# Patient Record
Sex: Male | Born: 1980 | Race: Black or African American | Hispanic: No | Marital: Single | Smoking: Current some day smoker
Health system: Southern US, Community
[De-identification: ages and names within clinical notes are randomized; demographics above are authoritative.]

---

## 2002-08-04 ENCOUNTER — Emergency Department (HOSPITAL_COMMUNITY): Admission: EM | Admit: 2002-08-04 | Discharge: 2002-08-04 | Payer: Self-pay

## 2002-08-04 ENCOUNTER — Encounter: Payer: Self-pay | Admitting: Emergency Medicine

## 2008-05-24 ENCOUNTER — Emergency Department (HOSPITAL_COMMUNITY): Admission: EM | Admit: 2008-05-24 | Discharge: 2008-05-24 | Payer: Self-pay | Admitting: Emergency Medicine

## 2010-12-21 ENCOUNTER — Emergency Department (HOSPITAL_COMMUNITY)
Admission: EM | Admit: 2010-12-21 | Discharge: 2010-12-21 | Disposition: A | Discharge status: Home / Self Care | Payer: No Typology Code available for payment source | Attending: Emergency Medicine | Admitting: Emergency Medicine

## 2010-12-21 ENCOUNTER — Emergency Department (HOSPITAL_COMMUNITY): Payer: No Typology Code available for payment source

## 2010-12-21 DIAGNOSIS — R221 Localized swelling, mass and lump, neck: Secondary | ICD-10-CM | POA: Insufficient documentation

## 2010-12-21 DIAGNOSIS — R220 Localized swelling, mass and lump, head: Secondary | ICD-10-CM | POA: Insufficient documentation

## 2010-12-21 DIAGNOSIS — S0003XA Contusion of scalp, initial encounter: Secondary | ICD-10-CM | POA: Insufficient documentation

## 2010-12-21 DIAGNOSIS — IMO0002 Reserved for concepts with insufficient information to code with codable children: Secondary | ICD-10-CM | POA: Insufficient documentation

## 2010-12-21 DIAGNOSIS — Y929 Unspecified place or not applicable: Secondary | ICD-10-CM | POA: Insufficient documentation

## 2010-12-21 DIAGNOSIS — R51 Headache: Secondary | ICD-10-CM | POA: Insufficient documentation

## 2012-02-13 ENCOUNTER — Encounter (HOSPITAL_COMMUNITY): Payer: Self-pay | Admitting: *Deleted

## 2012-02-13 ENCOUNTER — Emergency Department (HOSPITAL_COMMUNITY)
Admission: EM | Admit: 2012-02-13 | Discharge: 2012-02-14 | Disposition: A | Discharge status: Home / Self Care | Payer: Self-pay | Attending: Emergency Medicine | Admitting: Emergency Medicine

## 2012-02-13 ENCOUNTER — Emergency Department (HOSPITAL_COMMUNITY): Payer: Self-pay

## 2012-02-13 DIAGNOSIS — J329 Chronic sinusitis, unspecified: Secondary | ICD-10-CM | POA: Insufficient documentation

## 2012-02-13 DIAGNOSIS — R05 Cough: Secondary | ICD-10-CM

## 2012-02-13 DIAGNOSIS — R51 Headache: Secondary | ICD-10-CM | POA: Insufficient documentation

## 2012-02-13 DIAGNOSIS — R059 Cough, unspecified: Secondary | ICD-10-CM | POA: Insufficient documentation

## 2012-02-13 DIAGNOSIS — R509 Fever, unspecified: Secondary | ICD-10-CM | POA: Insufficient documentation

## 2012-02-13 NOTE — ED Notes (Signed)
Pt reports generalized body aches x 1 week

## 2012-02-14 MED ORDER — AZITHROMYCIN 250 MG PO TABS
500.0000 mg | ORAL_TABLET | Freq: Once | ORAL | Status: AC
  Administered 2012-02-14: 500 mg via ORAL
  Filled 2012-02-14: qty 2

## 2012-02-14 MED ORDER — AZITHROMYCIN 250 MG PO TABS: 250.0000 mg | ORAL_TABLET | Freq: Every day | ORAL | Status: AC

## 2012-02-14 MED ORDER — NAPROXEN 500 MG PO TABS: 500.0000 mg | ORAL_TABLET | Freq: Two times a day (BID) | ORAL | Status: AC

## 2012-02-14 NOTE — ED Provider Notes (Signed)
History     CSN: [id number]  Arrival date & time 02/13/12  2321   First MD Initiated Contact with Patient 02/14/12 0006      Chief Complaint  Patient presents with  . Influenza    (Consider location/radiation/quality/duration/timing/severity/associated sxs/prior treatment) HPI Comments: 31 year old male with no significant past medical history presents with a complaint of cough over the last week. This is gradually getting worse, productive of some phlegm and associated with headaches, sinus pressure and drainage. Symptoms are persistent, but gradual in onset, are associated with mild fevers and myalgias which have been treated with ibuprofen successfully.  Patient is a 31 y.o. male presenting with flu symptoms. The history is provided by the patient and a relative.  Influenza Pertinent negatives include no chest pain.    History reviewed. No pertinent past medical history.  History reviewed. No pertinent past surgical history.  No family history on file.  History  Substance Use Topics  . Smoking status: Current Some Day Smoker  . Smokeless tobacco: Not on file  . Alcohol Use: Yes      Review of Systems  Constitutional: Positive for fever.  HENT: Positive for congestion, rhinorrhea, postnasal drip and sinus pressure. Negative for ear pain, sore throat, neck pain and neck stiffness.   Respiratory: Positive for cough.   Cardiovascular: Negative for chest pain and leg swelling.  Gastrointestinal: Negative for nausea and vomiting.  Skin: Negative for rash.    Allergies  Review of patient's allergies indicates no known allergies.  Home Medications   Current Outpatient Rx  Name Route Sig Dispense Refill  . AZITHROMYCIN 250 MG PO TABS Oral Take 1 tablet (250 mg total) by mouth daily. 500mg  PO day 1, then 250mg  PO days 205 6 tablet 0  . NAPROXEN 500 MG PO TABS Oral Take 1 tablet (500 mg total) by mouth 2 (two) times daily with a meal. 30 tablet 0    BP 100/62  Pulse  109  Temp(Src) 100.7 F (38.2 C) (Oral)  Resp 20  Ht 5\' 8"  (1.727 m)  Wt 140 lb (63.504 kg)  BMI 21.29 kg/m2  SpO2 98%  Physical Exam  Nursing note and vitals reviewed. Constitutional: He appears well-developed and well-nourished. No distress.  HENT:  Head: Normocephalic and atraumatic.  Mouth/Throat: No oropharyngeal exudate.       Nasal passages with swollen turbinates and drainage bilaterally, oropharynx with mild erythema, no trismus  Eyes: Conjunctivae and EOM are normal. Pupils are equal, round, and reactive to light. Right eye exhibits no discharge. Left eye exhibits no discharge. No scleral icterus.  Neck: Normal range of motion. Neck supple. No JVD present. No thyromegaly present.  Cardiovascular: Normal rate, regular rhythm, normal heart sounds and intact distal pulses.  Exam reveals no gallop and no friction rub.   No murmur heard. Pulmonary/Chest: Effort normal and breath sounds normal. No respiratory distress. He has no wheezes. He has no rales.  Abdominal: There is no tenderness.  Musculoskeletal: He exhibits no tenderness.  Lymphadenopathy:    He has no cervical adenopathy.  Neurological: He is alert.  Skin: Skin is warm and dry. No rash noted. No erythema.  Psychiatric: He has a normal mood and affect. His behavior is normal.    ED Course  Procedures (including critical care time)  Labs Reviewed - No data to display No results found.   1. Sinusitis   2. Cough   3. Headache       MDM  Lungs are clear, oxygen  levels 98%, temperature of 100.7 with a pulse of 109 with symptoms consistent with a sinusitis and possible bronchitis. Will treat with Zithromax for infection including both lungs and sinuses. Patient has successfully taken oral medications and food and fluids without difficulty. We'll have followup as indicated. The patient has expressed his understanding of indications for followup.  Discharge Prescriptions include:  #1 Naprosyn #2  Zithromax        Vida Roller, MD 02/14/12 [phone]

## 2012-02-14 NOTE — Discharge Instructions (Signed)
 RESOURCE GUIDE  Dental Problems  Patients with Medicaid: [facility]                     [facility] [address]                                           [address] Phone:  [phone]                                                  Phone:  [phone]  If unable to pay or uninsured, contact:  [facility] or [facility] to become qualified for the adult dental clinic.  Chronic Pain Problems Contact [facility]  [phone] Patients need to be referred by their primary care doctor.  Insufficient Money for Medicine Contact [employer]:  call "211" or [facility] 271-5999.  No Primary Care Doctor Call [facility]  832-8000 Other agencies that provide inexpensive medical care    [facility]  832-8035    [facility]  832-7272    [facility]  [phone]    [facility] Clinic  [phone]    [facility]  [phone]    [facility]  [phone]  Psychological Services [facility]  [phone] [facility]  [phone] [facility]   800 853-5163 (emergency services 641-4993)  Substance Abuse Resources [facility]  336-882-2125 [facility] 336-784-9470 [facility] 336-285-9073 [facility] 336-845-3988 Residential & Outpatient Substance Abuse Program  800-659-3381  Abuse/Neglect [place] Child Abuse Hotline (336) 641-3795 [place] Child Abuse Hotline 800-378-5315 (After Hours)  Emergency Shelter [facility] (336) 271-5985  [facility] (336) 275-9566 [facility] (704) 372-4663  MRSA Hotline #:   832-7006    [place] [facility]  [facility] [place]     [employer]                          [facility] [address] [place]                       [address]      [address]  [place]                                                [place]                            [place] Phone:  [phone]                                   Phone:  [phone]                 Phone:  [phone]  [facility] Phone:    [phone]  [place] Child Abuse Hotline (336) 342-1394 (336) 342-3537 (After Hours)   

## 2012-02-14 NOTE — ED Notes (Signed)
Patient to room 5 from radiology. No distress. Equal chest rise and fall, regular, unlabored. Awaiting to be seen.

## 2012-02-14 NOTE — ED Notes (Signed)
MD at bedside to evaluate.

## 2013-03-24 ENCOUNTER — Emergency Department (HOSPITAL_COMMUNITY)
Admission: EM | Admit: 2013-03-24 | Discharge: 2013-03-24 | Disposition: A | Discharge status: Home / Self Care | Payer: 59 | Attending: Emergency Medicine | Admitting: Emergency Medicine

## 2013-03-24 ENCOUNTER — Emergency Department (HOSPITAL_COMMUNITY): Payer: 59

## 2013-03-24 ENCOUNTER — Encounter (HOSPITAL_COMMUNITY): Payer: Self-pay | Admitting: Emergency Medicine

## 2013-03-24 DIAGNOSIS — F17200 Nicotine dependence, unspecified, uncomplicated: Secondary | ICD-10-CM | POA: Insufficient documentation

## 2013-03-24 DIAGNOSIS — R221 Localized swelling, mass and lump, neck: Secondary | ICD-10-CM | POA: Insufficient documentation

## 2013-03-24 DIAGNOSIS — R51 Headache: Secondary | ICD-10-CM | POA: Insufficient documentation

## 2013-03-24 DIAGNOSIS — K047 Periapical abscess without sinus: Secondary | ICD-10-CM

## 2013-03-24 DIAGNOSIS — K029 Dental caries, unspecified: Secondary | ICD-10-CM | POA: Insufficient documentation

## 2013-03-24 DIAGNOSIS — K089 Disorder of teeth and supporting structures, unspecified: Secondary | ICD-10-CM | POA: Insufficient documentation

## 2013-03-24 DIAGNOSIS — R220 Localized swelling, mass and lump, head: Secondary | ICD-10-CM | POA: Insufficient documentation

## 2013-03-24 LAB — CBC WITH DIFFERENTIAL/PLATELET
Eosinophils Absolute: 0.2 10*3/uL (ref 0.0–0.7)
Hemoglobin: 15.5 g/dL (ref 13.0–17.0)
Lymphocytes Relative: 13 % (ref 12–46)
Lymphs Abs: 1.3 10*3/uL (ref 0.7–4.0)
MCH: 31.2 pg (ref 26.0–34.0)
MCV: 85.9 fL (ref 78.0–100.0)
Monocytes Relative: 9 % (ref 3–12)
Neutrophils Relative %: 76 % (ref 43–77)
Platelets: 188 10*3/uL (ref 150–400)
RBC: 4.97 MIL/uL (ref 4.22–5.81)
WBC: 9.3 10*3/uL (ref 4.0–10.5)

## 2013-03-24 LAB — BASIC METABOLIC PANEL
CO2: 29 mEq/L (ref 19–32)
GFR calc non Af Amer: >90 mL/min (ref >90)
Glucose, Bld: 97 mg/dL (ref 70–99)
Potassium: 4 mEq/L (ref 3.5–5.1)
Sodium: 137 mEq/L (ref 135–145)

## 2013-03-24 MED ORDER — SODIUM CHLORIDE 0.9 % IV SOLN
INTRAVENOUS | Status: DC
  Administered 2013-03-24: 1000 mL via INTRAVENOUS

## 2013-03-24 MED ORDER — IOHEXOL 300 MG/ML  SOLN
75.0000 mL | Freq: Once | INTRAMUSCULAR | Status: AC | PRN
  Administered 2013-03-24: 75 mL via INTRAVENOUS

## 2013-03-24 MED ORDER — CLINDAMYCIN PHOSPHATE 600 MG/50ML IV SOLN
600.0000 mg | Freq: Once | INTRAVENOUS | Status: AC
  Administered 2013-03-24: 600 mg via INTRAVENOUS
  Filled 2013-03-24: qty 50

## 2013-03-24 MED ORDER — HYDROCODONE-ACETAMINOPHEN 5-325 MG PO TABS: 1.0000 | ORAL_TABLET | ORAL | Status: AC | PRN

## 2013-03-24 MED ORDER — CLINDAMYCIN HCL 150 MG PO CAPS: 300.0000 mg | ORAL_CAPSULE | Freq: Three times a day (TID) | ORAL | Status: AC

## 2013-03-24 NOTE — ED Notes (Signed)
Pt states he started having dental pain in his upper front teeth on the left side on Sunday. Pt reports that his face started to swell yesterday, but the swelling was worse when he woke up this morning.

## 2013-03-29 NOTE — ED Provider Notes (Signed)
Medical screening examination/treatment/procedure(s) were performed by non-physician practitioner and as supervising physician I was immediately available for consultation/collaboration. Evonte Prestage, MD, FACEP   Hanni Milford L Martin Belling, MD 03/29/13 1635 

## 2013-03-29 NOTE — ED Provider Notes (Signed)
History     CSN: [id number]  Arrival date & time 03/24/13  1011   First MD Initiated Contact with Patient 03/24/13 1124      Chief Complaint  Patient presents with  . Dental Pain  . Abscess    (Consider location/radiation/quality/duration/timing/severity/associated sxs/prior treatment) Patient is a 32 y.o. male presenting with tooth pain. The history is provided by the patient.  Dental PainThe primary symptoms include mouth pain. Primary symptoms do not include fever, shortness of breath or sore throat. The symptoms began 2 days ago. The symptoms are worsening. The symptoms are new. The symptoms occur constantly.  Mouth pain is worsening. Affected locations include: teeth. At its highest the mouth pain was at 10/10. The mouth pain is currently at 10/10.  Additional symptoms include: dental sensitivity to temperature, gum swelling and facial swelling. Medical issues include: periodontal disease.    History reviewed. No pertinent past medical history.  History reviewed. No pertinent past surgical history.  History reviewed. No pertinent family history.  History  Substance Use Topics  . Smoking status: Current Some Day Smoker -- 0.40 packs/day    Types: Cigarettes  . Smokeless tobacco: Never Used  . Alcohol Use: Yes      Review of Systems  Constitutional: Negative for fever and chills.  HENT: Positive for facial swelling and dental problem. Negative for sore throat, neck pain and neck stiffness.   Respiratory: Negative for shortness of breath.     Allergies  Review of patient's allergies indicates no known allergies.  Home Medications   Current Outpatient Rx  Name  Route  Sig  Dispense  Refill  . ibuprofen (ADVIL,MOTRIN) 800 MG tablet   Oral   Take 800 mg by mouth every 8 (eight) hours as needed for pain.         . clindamycin (CLEOCIN) 150 MG capsule   Oral   Take 2 capsules (300 mg total) by mouth 3 (three) times daily.   42 capsule   0   .  HYDROcodone-acetaminophen (NORCO/VICODIN) 5-325 MG per tablet   Oral   Take 1 tablet by mouth every 4 (four) hours as needed for pain.   20 tablet   0     BP 123/85  Pulse 78  Temp(Src) 98.1 F (36.7 C) (Oral)  Resp 19  Ht 5\' 8"  (1.727 m)  Wt 145 lb (65.772 kg)  BMI 22.05 kg/m2  SpO2 98%  Physical Exam  Constitutional: He is oriented to person, place, and time. He appears well-developed and well-nourished. No distress.  HENT:  Head: Atraumatic.  Right Ear: Tympanic membrane and external ear normal.  Left Ear: Tympanic membrane and external ear normal.  Mouth/Throat: Oropharynx is clear and moist and mucous membranes are normal. No oral lesions. Dental caries present.  Pt with generalized poor dentition and oral hygiene.  He has generalized edema along left upper gingiva with left buccal mucosal edema extending to infraorbital area.  There is no induration, no erythema or fluctuance of this soft tissue.    Eyes: Conjunctivae are normal.  Neck: Normal range of motion. Neck supple.  Cardiovascular: Normal rate and normal heart sounds.   Pulmonary/Chest: Effort normal.  Abdominal: He exhibits no distension.  Musculoskeletal: Normal range of motion.  Lymphadenopathy:    He has no cervical adenopathy.  Neurological: He is alert and oriented to person, place, and time.  Skin: Skin is warm and dry. No erythema.  Psychiatric: He has a normal mood and affect.    ED  Course  Procedures (including critical care time)  Pt was given IV clindamycin prior to dc home.  Labs Reviewed  CBC WITH DIFFERENTIAL - Abnormal; Notable for the following:    MCHC 36.3 (*)    All other components within normal limits  BASIC METABOLIC PANEL   No results found.   1. Dental abscess       MDM  Patients labs and/or radiological studies were viewed and considered during the medical decision making and disposition process.  Ct scan discussed with patient.  He was prescribed hydrocodone,   Clindamycin.  Advised to avoid applying ice or heat to his infected area.  Dental referrals given.  Also advised return here for a recheck in the interim for any worsened swelling or pain.  Prior to dc, the infraorbital edema was actually a little bit improved.  Pt understands plan.        Burgess Amor, PA-C 03/29/13 1233

## 2014-10-12 IMAGING — CT CT MAXILLOFACIAL W/ CM
3 of 4 series · 16 of 47 positions shown, 19 images · IV contrast (omnipaque)
Comparison: None

CLINICAL DATA: 32-year-old male with left upper teeth and facial
pain.

CT MAXILLOFACIAL WITH CONTRAST
TECHNIQUE: Multidetector CT imaging of the maxillofacial
structures was performed with intravenous contrast. Multiplanar CT
image reconstructions were also generated.
Contrast: 75mL OMNIPAQUE IOHEXOL 300 MG/ML  SOLN

[Series 2: max st axial · axial · 0.31mm/px · z∈[+6,+150]mm · 11 of 84 slices shown, 14 images]
[im 6/84  brain]
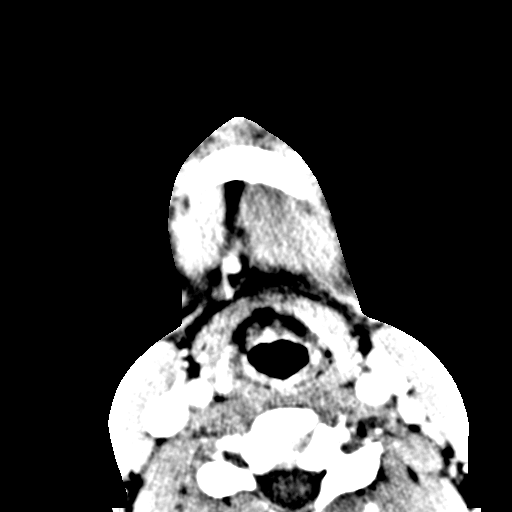
[im 6/84  bone]
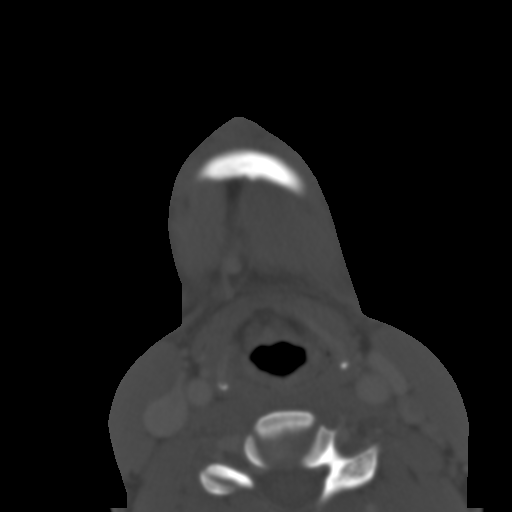
[im 12/84  bone]
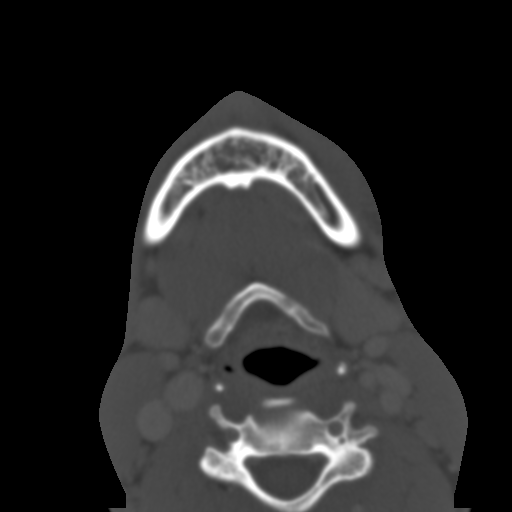
[im 21/84  bone]
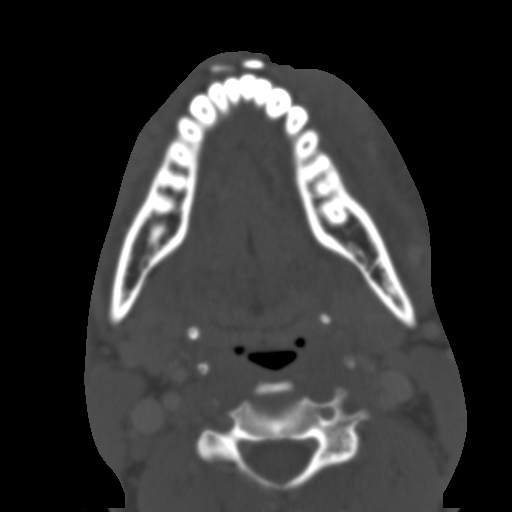
[im 26/84  bone]
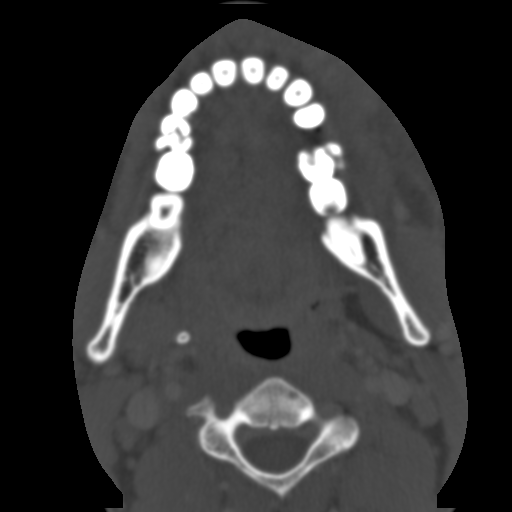
[im 35/84  brain]
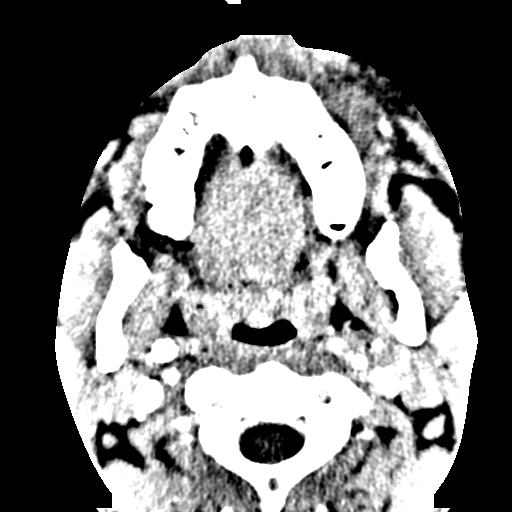
[im 35/84  bone]
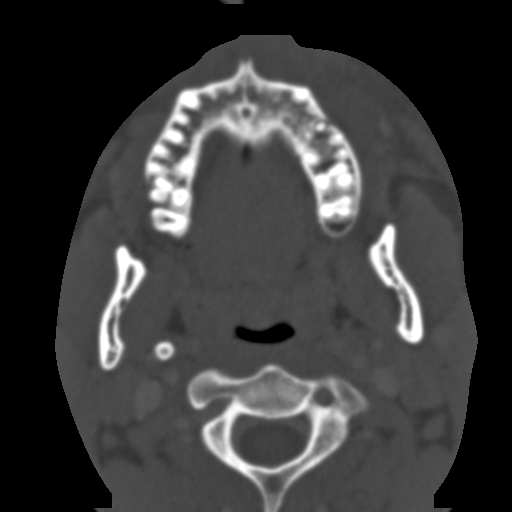
[im 43/84  bone]
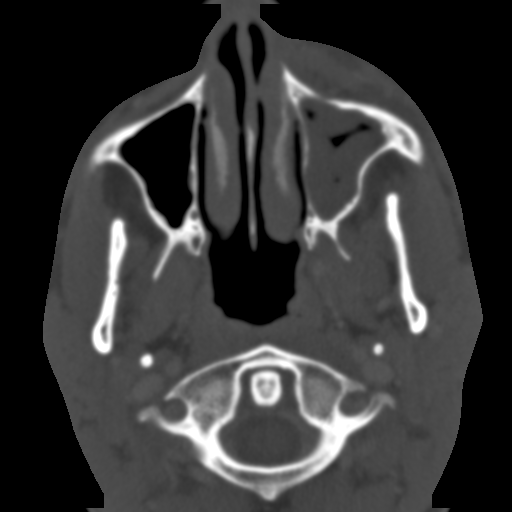
[im 49/84  bone]
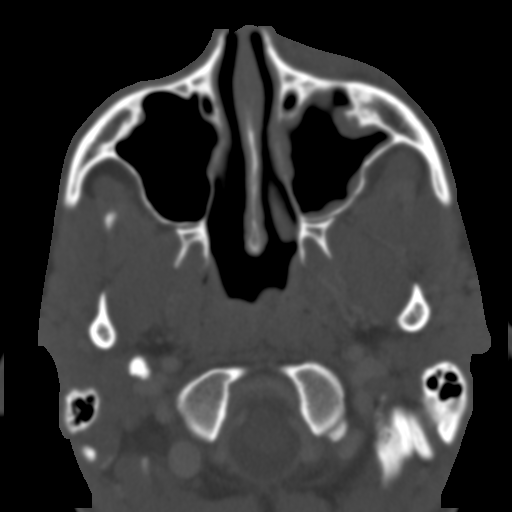
[im 58/84  bone]
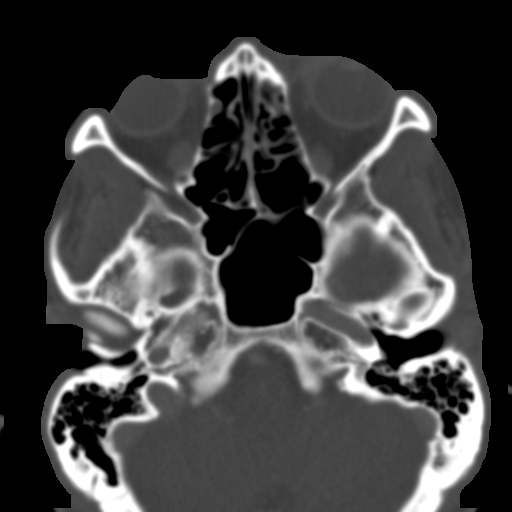
[im 63/84  brain]
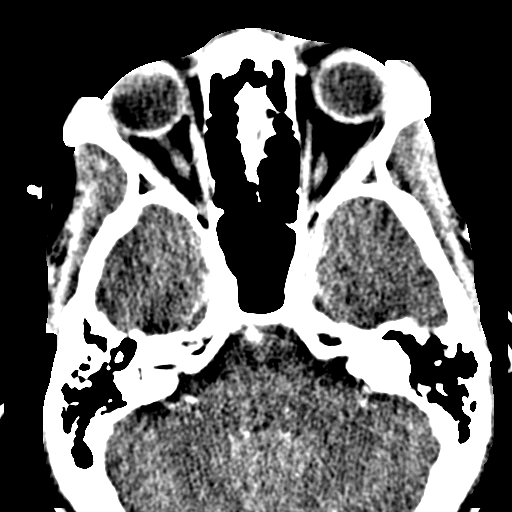
[im 63/84  bone]
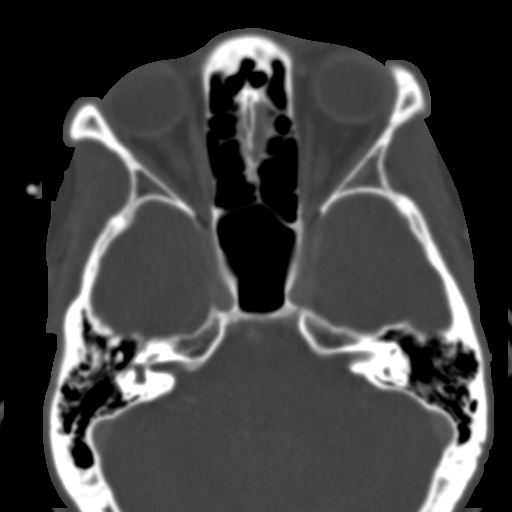
[im 72/84  bone]
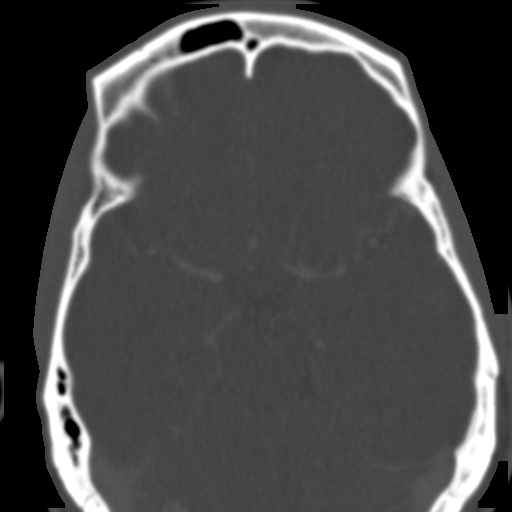
[im 78/84  bone]
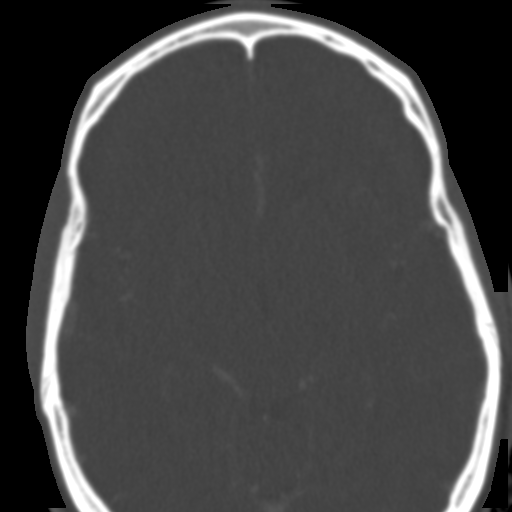

[Series 4: max st coro · coronal · 0.30mm/px · 3 of 85 slices shown]
[im 29/85  bone]
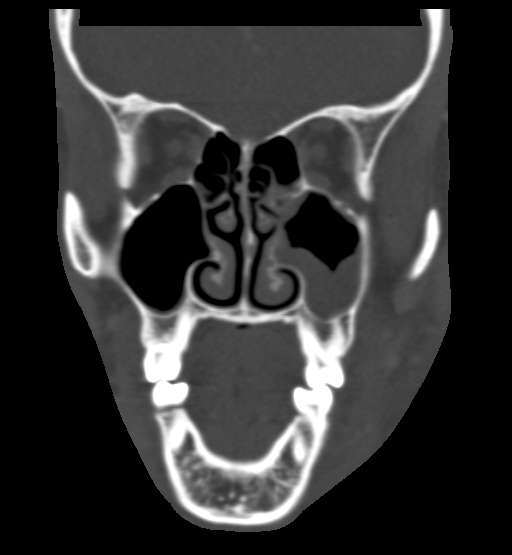
[im 38/85  bone]
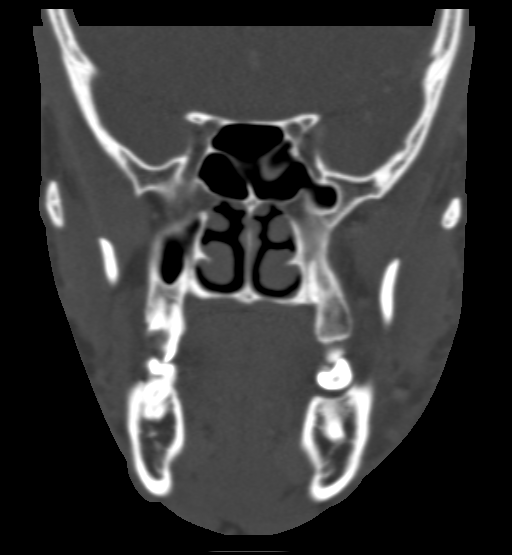
[im 47/85  bone]
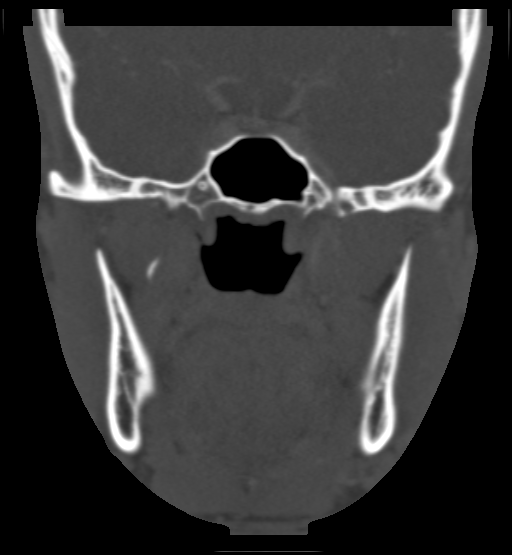

[Series 7: max bone sag · sagittal · 0.34mm/px · 2 of 112 slices shown]
[im 38/112  bone]
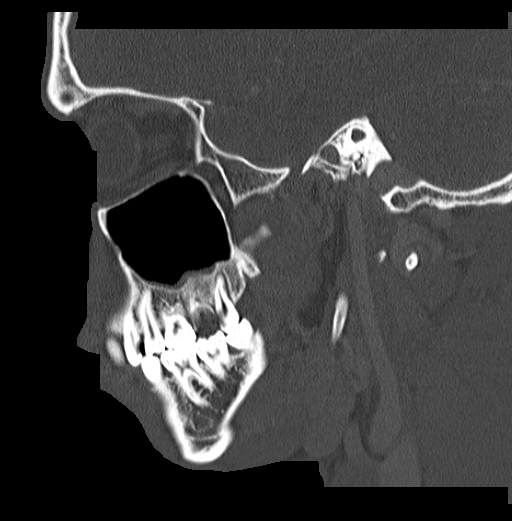
[im 75/112  bone]
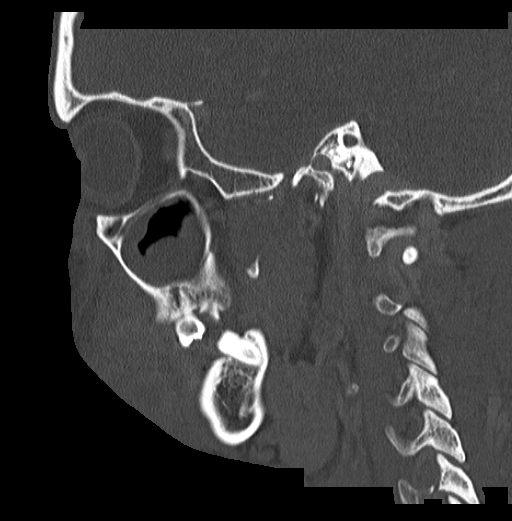

[16 of 47 positions shown; findings below may reference images not displayed]

FINDINGS: Left tissue swelling is noted without discrete soft
tissue abscess.
Caries and periapical abscesses of teeth #1, #2, #13 and #16 noted.
A cavity of teeth #15 has also identified.
Mucosal thickening within the left maxillary sinus is noted.
No focal bony abnormalities are otherwise identified.
The orbits and globes are within normal limits.
The remainder the paranasal sinuses, mastoid air cells and
middle/inner ears are clear.
IMPRESSION: Caries and periapical abscesses of teeth #1, #2, #13 and #16 with
diffuse left facial soft tissue swelling.  No evidence of focal
soft tissue abscess.

## 2020-10-31 ENCOUNTER — Emergency Department (HOSPITAL_COMMUNITY)
Admission: EM | Admit: 2020-10-31 | Discharge: 2020-10-31 | Disposition: A | Discharge status: Home / Self Care | Payer: Self-pay | Attending: Emergency Medicine | Admitting: Emergency Medicine

## 2020-10-31 ENCOUNTER — Encounter (HOSPITAL_COMMUNITY): Payer: Self-pay | Admitting: Emergency Medicine

## 2020-10-31 ENCOUNTER — Other Ambulatory Visit: Payer: Self-pay

## 2020-10-31 DIAGNOSIS — F17210 Nicotine dependence, cigarettes, uncomplicated: Secondary | ICD-10-CM | POA: Insufficient documentation

## 2020-10-31 DIAGNOSIS — J019 Acute sinusitis, unspecified: Secondary | ICD-10-CM | POA: Insufficient documentation

## 2020-10-31 MED ORDER — AMOXICILLIN 500 MG PO CAPS: 500.0000 mg | ORAL_CAPSULE | Freq: Three times a day (TID) | ORAL | 0 refills | Status: AC

## 2020-10-31 MED ORDER — AMOXICILLIN 250 MG PO CAPS
500.0000 mg | ORAL_CAPSULE | Freq: Once | ORAL | Status: AC
  Administered 2020-10-31: 500 mg via ORAL
  Filled 2020-10-31: qty 2

## 2020-10-31 MED ORDER — KETOROLAC TROMETHAMINE 30 MG/ML IJ SOLN
15.0000 mg | Freq: Once | INTRAMUSCULAR | Status: AC
  Administered 2020-10-31: 15 mg via INTRAMUSCULAR
  Filled 2020-10-31: qty 1

## 2020-10-31 NOTE — ED Notes (Signed)
ED Provider at bedside. 

## 2020-10-31 NOTE — ED Provider Notes (Signed)
[facility] EMERGENCY DEPARTMENT Provider Note   CSN: [id number] Arrival date & time: 10/31/20  0028   Time seen 2:25 AM  History Chief Complaint  Patient presents with  . Headache    Andres Owens is a 39 y.o. male.  HPI   Patient states 1 week ago he started having a sinus infection with nasal congestion, some sneezing.  He states initially he was having yellow and reddish nasal drainage but today it was clear.  He denies fever or chills, sore throat, or coughing.  He states December 8 and 9 he started having a headache which he describes as frontal and in the temples and sometimes behind his eyes.  He states also the top of his head is sore if he touches it.  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  Patient is a smoker.  [facility]   History reviewed. No pertinent past medical history.  There are no problems to display for this patient.   History reviewed. No pertinent surgical history.     History reviewed. No pertinent family history.  Social History   Tobacco Use  . Smoking status: Current Some Day Smoker    Packs/day: 0.40    Types: Cigarettes  . Smokeless tobacco: Never Used  Substance Use Topics  . Alcohol use: Yes  . Drug use: Yes    Types: Marijuana  employed Patient smokes 2 black and mild cigars a day  Home Medications Prior to Admission medications   Medication Sig Start Date End Date Taking? Authorizing Provider  amoxicillin (AMOXIL) 500 MG capsule Take 1 capsule (500 mg total) by mouth 3 (three) times daily. 10/31/20   Devoria Albe, MD  clindamycin (CLEOCIN) 150 MG capsule Take 2 capsules (300 mg total) by mouth 3 (three) times daily. 03/24/13   Burgess Amor, PA-C  HYDROcodone-acetaminophen (NORCO/VICODIN) 5-325 MG per tablet Take 1 tablet by mouth every 4 (four) hours as needed for pain. 03/24/13   Burgess Amor, PA-C  ibuprofen (ADVIL,MOTRIN) 800 MG tablet Take 800 mg by mouth every 8 (eight) hours as  needed for pain.    [provider]    Allergies    Patient has no known allergies.  Review of Systems   Review of Systems  All other systems reviewed and are negative.   Physical Exam Updated Vital Signs BP (!) 133/97 (BP Location: Right Arm)   Pulse 74   Temp 98.2 F (36.8 C) (Oral)   Resp 18   Ht 5\' 8"  (1.727 m)   Wt 63.5 kg   SpO2 100%   BMI 21.29 kg/m   Physical Exam Vitals and nursing note reviewed.  Constitutional:      General: He is not in acute distress.    Appearance: Normal appearance. He is normal weight. He is not ill-appearing or toxic-appearing.  HENT:     Head: Normocephalic and atraumatic.     Comments: Patient has tenderness when I palpate the sides of his nose consistent with ethmoid sinusitis, he is not tender however over the frontal or maxillary sinuses.    Right Ear: External ear normal.     Left Ear: External ear normal.     Nose: Congestion present.     Mouth/Throat:     Mouth: Mucous membranes are moist.     Pharynx: No oropharyngeal exudate or posterior oropharyngeal erythema.  Eyes:     Extraocular Movements: Extraocular movements intact.     Conjunctiva/sclera: Conjunctivae normal.  Pupils: Pupils are equal, round, and reactive to light.  Cardiovascular:     Rate and Rhythm: Normal rate and regular rhythm.     Pulses: Normal pulses.     Heart sounds: Normal heart sounds. No murmur heard.   Pulmonary:     Effort: Pulmonary effort is normal. No respiratory distress.     Breath sounds: Normal breath sounds.  Musculoskeletal:        General: Normal range of motion.     Cervical back: Normal range of motion.  Skin:    General: Skin is warm and dry.  Neurological:     General: No focal deficit present.     Mental Status: He is alert and oriented to person, place, and time.     Cranial Nerves: No cranial nerve deficit.  Psychiatric:        Mood and Affect: Mood normal.        Behavior: Behavior normal.        Thought  Content: Thought content normal.     ED Results / Procedures / Treatments   Labs (all labs ordered are listed, but only abnormal results are displayed) Labs Reviewed - No data to display  EKG None  Radiology No results found.  Procedures Procedures (including critical care time)  Medications Ordered in ED Medications  ketorolac (TORADOL) 30 MG/ML injection 15 mg (has no administration in time range)  amoxicillin (AMOXIL) capsule 500 mg (has no administration in time range)    ED Course  I have reviewed the triage vital signs and the nursing notes.  Pertinent labs & imaging results that were available during my care of the patient were reviewed by me and considered in my medical decision making (see chart for details).    MDM Rules/Calculators/A&P                         Patient symptoms are consistent with sinusitis.  Patient is a smoker, he was started on antibiotics and he was told over-the-counter medications to take.  Patient was given Toradol so he can drive home.   Final Clinical Impression(s) / ED Diagnoses Final diagnoses:  Acute sinusitis, recurrence not specified, unspecified location    Rx / DC Orders ED Discharge Orders         Ordered    amoxicillin (AMOXIL) 500 MG capsule  3 times daily        10/31/20 0244         Plan discharge  Devoria Albe, MD, Concha Pyo, MD 10/31/20 [phone]

## 2020-10-31 NOTE — ED Notes (Signed)
Patient discharged to home.  All discharge instructions reviewed.  Patient able to verbalize understanding via teachback method.  Ambulatory out of ED.  

## 2020-10-31 NOTE — ED Triage Notes (Signed)
Pt c/o headache on and off since last week after having a sinus infection. Pt states tylenol hasn't helped.

## 2020-10-31 NOTE — Discharge Instructions (Signed)
Drink plenty of fluids.  Use heat over your face for comfort.  Take acetaminophen 500 mg plus ibuprofen 400 mg every 6 hours as needed for pain.  Use Nasacort nasal spray over-the-counter for your nasal congestion.  Also use Claritin or Zyrtec over-the-counter once a day for your nasal congestion.  Take the antibiotics until gone.  Return to the emergency department if your headache gets worse or you develop a fever.

## 2020-10-31 NOTE — ED Notes (Signed)
Took Tylenol around 2300 tonight with no improvement of symptoms.
